# Patient Record
Sex: Female | Born: 1935 | Race: White | Hispanic: No | Marital: Single
Health system: Southern US, Community
[De-identification: ages and names within clinical notes are randomized; demographics above are authoritative.]

## PROBLEM LIST (undated history)

## (undated) DIAGNOSIS — I248 Other forms of acute ischemic heart disease: Secondary | ICD-10-CM

## (undated) DIAGNOSIS — Z87448 Personal history of other diseases of urinary system: Secondary | ICD-10-CM

## (undated) DIAGNOSIS — I5189 Other ill-defined heart diseases: Secondary | ICD-10-CM

## (undated) DIAGNOSIS — I4891 Unspecified atrial fibrillation: Secondary | ICD-10-CM

## (undated) HISTORY — DX: Unspecified atrial fibrillation: I48.91

## (undated) HISTORY — DX: Other ill-defined heart diseases: I51.89

## (undated) HISTORY — DX: Other forms of acute ischemic heart disease: I24.8

## (undated) HISTORY — DX: Personal history of other diseases of urinary system: Z87.448

---

## 2015-01-17 ENCOUNTER — Ambulatory Visit
Admit: 2015-01-17 | Disposition: A | Discharge status: Home / Self Care | Payer: Self-pay | Attending: Internal Medicine | Admitting: Internal Medicine

## 2015-01-31 ENCOUNTER — Inpatient Hospital Stay
Admit: 2015-01-31 | Disposition: A | Discharge status: Skilled Nursing Facility | Payer: Self-pay | Attending: Internal Medicine | Admitting: Internal Medicine

## 2015-01-31 LAB — URINALYSIS, COMPLETE
BACTERIA: NONE SEEN
BILIRUBIN, UR: NEGATIVE
GLUCOSE, UR: NEGATIVE mg/dL (ref 0–75)
Ketone: NEGATIVE
Leukocyte Esterase: NEGATIVE
Nitrite: NEGATIVE
PH: 5 (ref 4.5–8.0)
Protein: 100
SPECIFIC GRAVITY: 1.017 (ref 1.003–1.030)
SQUAMOUS EPITHELIAL: NONE SEEN

## 2015-01-31 LAB — CBC WITH DIFFERENTIAL/PLATELET
BASOS ABS: 0 10*3/uL (ref 0.0–0.1)
Basophil %: 0.4 %
EOS PCT: 0 %
Eosinophil #: 0 10*3/uL (ref 0.0–0.7)
HCT: 35 % (ref 35.0–47.0)
HGB: 11.4 g/dL — ABNORMAL LOW (ref 12.0–16.0)
LYMPHS ABS: 0.6 10*3/uL — AB (ref 1.0–3.6)
LYMPHS PCT: 7.5 %
MCH: 30.6 pg (ref 26.0–34.0)
MCHC: 32.7 g/dL (ref 32.0–36.0)
MCV: 94 fL (ref 80–100)
Monocyte #: 0.3 x10 3/mm (ref 0.2–0.9)
Monocyte %: 4.4 %
Neutrophil #: 7 10*3/uL — ABNORMAL HIGH (ref 1.4–6.5)
Neutrophil %: 87.7 %
Platelet: 131 10*3/uL — ABNORMAL LOW (ref 150–440)
RBC: 3.73 10*6/uL — ABNORMAL LOW (ref 3.80–5.20)
RDW: 13 % (ref 11.5–14.5)
WBC: 8 10*3/uL (ref 3.6–11.0)

## 2015-01-31 LAB — TROPONIN I
TROPONIN-I: 0.13 ng/mL — AB
Troponin-I: 0.11 ng/mL — ABNORMAL HIGH
Troponin-I: 0.13 ng/mL — ABNORMAL HIGH

## 2015-01-31 LAB — LACTIC ACID, PLASMA: Lactic Acid, Venous: 1.9 mmol/L

## 2015-01-31 LAB — PHOSPHORUS: Phosphorus: 3.6 mg/dL

## 2015-01-31 LAB — RAPID INFLUENZA A&B ANTIGENS (ARMC ONLY)

## 2015-01-31 LAB — COMPREHENSIVE METABOLIC PANEL
AST: 50 U/L — AB
Albumin: 3.5 g/dL
Alkaline Phosphatase: 69 U/L
Anion Gap: 10 (ref 7–16)
BILIRUBIN TOTAL: 0.6 mg/dL
BUN: 48 mg/dL — ABNORMAL HIGH
CALCIUM: 8.2 mg/dL — AB
Chloride: 104 mmol/L
Co2: 19 mmol/L — ABNORMAL LOW
Creatinine: 1.93 mg/dL — ABNORMAL HIGH
EGFR (African American): 25 — ABNORMAL LOW
EGFR (Non-African Amer.): 22 — ABNORMAL LOW
Glucose: 221 mg/dL — ABNORMAL HIGH
POTASSIUM: 5.1 mmol/L
SGPT (ALT): 30 U/L
Sodium: 133 mmol/L — ABNORMAL LOW
TOTAL PROTEIN: 6.7 g/dL

## 2015-01-31 LAB — PROTIME-INR
INR: 1.2
PROTHROMBIN TIME: 15.1 s — AB

## 2015-01-31 LAB — MAGNESIUM: Magnesium: 1.8 mg/dL

## 2015-02-01 DIAGNOSIS — I340 Nonrheumatic mitral (valve) insufficiency: Secondary | ICD-10-CM | POA: Diagnosis not present

## 2015-02-01 DIAGNOSIS — E860 Dehydration: Secondary | ICD-10-CM

## 2015-02-01 DIAGNOSIS — I4891 Unspecified atrial fibrillation: Secondary | ICD-10-CM | POA: Diagnosis not present

## 2015-02-01 DIAGNOSIS — R7989 Other specified abnormal findings of blood chemistry: Secondary | ICD-10-CM | POA: Diagnosis not present

## 2015-02-01 LAB — CBC WITH DIFFERENTIAL/PLATELET
Basophil #: 0 10*3/uL (ref 0.0–0.1)
Basophil %: 0.1 %
EOS PCT: 0 %
Eosinophil #: 0 10*3/uL (ref 0.0–0.7)
HCT: 32.2 % — AB (ref 35.0–47.0)
HGB: 10.4 g/dL — ABNORMAL LOW (ref 12.0–16.0)
Lymphocyte #: 0.3 10*3/uL — ABNORMAL LOW (ref 1.0–3.6)
Lymphocyte %: 1.2 %
MCH: 30.4 pg (ref 26.0–34.0)
MCHC: 32.5 g/dL (ref 32.0–36.0)
MCV: 94 fL (ref 80–100)
MONOS PCT: 2.5 %
Monocyte #: 0.6 x10 3/mm (ref 0.2–0.9)
NEUTROS ABS: 23.7 10*3/uL — AB (ref 1.4–6.5)
NEUTROS PCT: 96.2 %
Platelet: 120 10*3/uL — ABNORMAL LOW (ref 150–440)
RBC: 3.43 10*6/uL — ABNORMAL LOW (ref 3.80–5.20)
RDW: 13 % (ref 11.5–14.5)
WBC: 24.6 10*3/uL — AB (ref 3.6–11.0)

## 2015-02-01 LAB — BASIC METABOLIC PANEL
Anion Gap: 8 (ref 7–16)
BUN: 60 mg/dL — ABNORMAL HIGH
CALCIUM: 8.2 mg/dL — AB
CREATININE: 2.32 mg/dL — AB
Chloride: 107 mmol/L
Co2: 22 mmol/L
EGFR (African American): 20 — ABNORMAL LOW
GFR CALC NON AF AMER: 17 — AB
Glucose: 152 mg/dL — ABNORMAL HIGH
Potassium: 4.4 mmol/L
SODIUM: 137 mmol/L

## 2015-02-02 LAB — CBC WITH DIFFERENTIAL/PLATELET
BASOS PCT: 0.1 %
Basophil #: 0 10*3/uL (ref 0.0–0.1)
Eosinophil #: 0 10*3/uL (ref 0.0–0.7)
Eosinophil %: 0 %
HCT: 32.4 % — AB (ref 35.0–47.0)
HGB: 10.7 g/dL — AB (ref 12.0–16.0)
LYMPHS ABS: 0.2 10*3/uL — AB (ref 1.0–3.6)
LYMPHS PCT: 1 %
MCH: 30.9 pg (ref 26.0–34.0)
MCHC: 33.1 g/dL (ref 32.0–36.0)
MCV: 93 fL (ref 80–100)
Monocyte #: 0.4 x10 3/mm (ref 0.2–0.9)
Monocyte %: 2.2 %
Neutrophil #: 18 10*3/uL — ABNORMAL HIGH (ref 1.4–6.5)
Neutrophil %: 96.7 %
Platelet: 123 10*3/uL — ABNORMAL LOW (ref 150–440)
RBC: 3.47 10*6/uL — AB (ref 3.80–5.20)
RDW: 13.4 % (ref 11.5–14.5)
WBC: 18.7 10*3/uL — AB (ref 3.6–11.0)

## 2015-02-02 LAB — BASIC METABOLIC PANEL
Anion Gap: 16 (ref 7–16)
BUN: 72 mg/dL — AB
CHLORIDE: 100 mmol/L — AB
CREATININE: 2.82 mg/dL — AB
Calcium, Total: 7.9 mg/dL — ABNORMAL LOW
Co2: 20 mmol/L — ABNORMAL LOW
GFR CALC AF AMER: 16 — AB
GFR CALC NON AF AMER: 14 — AB
Glucose: 226 mg/dL — ABNORMAL HIGH
Potassium: 4 mmol/L
Sodium: 136 mmol/L

## 2015-02-03 DIAGNOSIS — R7989 Other specified abnormal findings of blood chemistry: Secondary | ICD-10-CM | POA: Diagnosis not present

## 2015-02-03 DIAGNOSIS — I4891 Unspecified atrial fibrillation: Secondary | ICD-10-CM | POA: Diagnosis not present

## 2015-02-03 LAB — CBC WITH DIFFERENTIAL/PLATELET
BASOS ABS: 0 10*3/uL (ref 0.0–0.1)
Basophil %: 0.1 %
Eosinophil #: 0 10*3/uL (ref 0.0–0.7)
Eosinophil %: 0 %
HCT: 31.7 % — AB (ref 35.0–47.0)
HGB: 10.2 g/dL — ABNORMAL LOW (ref 12.0–16.0)
LYMPHS PCT: 1.5 %
Lymphocyte #: 0.2 10*3/uL — ABNORMAL LOW (ref 1.0–3.6)
MCH: 29.7 pg (ref 26.0–34.0)
MCHC: 32.1 g/dL (ref 32.0–36.0)
MCV: 93 fL (ref 80–100)
MONOS PCT: 2.5 %
Monocyte #: 0.4 x10 3/mm (ref 0.2–0.9)
Neutrophil #: 14 10*3/uL — ABNORMAL HIGH (ref 1.4–6.5)
Neutrophil %: 95.9 %
PLATELETS: 131 10*3/uL — AB (ref 150–440)
RBC: 3.42 10*6/uL — AB (ref 3.80–5.20)
RDW: 13.2 % (ref 11.5–14.5)
WBC: 14.6 10*3/uL — AB (ref 3.6–11.0)

## 2015-02-03 LAB — BASIC METABOLIC PANEL
Anion Gap: 8 (ref 7–16)
BUN: 85 mg/dL — AB
Calcium, Total: 8.1 mg/dL — ABNORMAL LOW
Chloride: 104 mmol/L
Co2: 24 mmol/L
Creatinine: 3.12 mg/dL — ABNORMAL HIGH
EGFR (African American): 14 — ABNORMAL LOW
EGFR (Non-African Amer.): 12 — ABNORMAL LOW
Glucose: 194 mg/dL — ABNORMAL HIGH
Potassium: 4.1 mmol/L
SODIUM: 136 mmol/L

## 2015-02-03 LAB — VANCOMYCIN, TROUGH: VANCOMYCIN, TROUGH: 12 ug/mL

## 2015-02-04 LAB — BASIC METABOLIC PANEL
ANION GAP: 7 (ref 7–16)
BUN: 93 mg/dL — ABNORMAL HIGH
CO2: 25 mmol/L
CREATININE: 3.18 mg/dL — AB
Calcium, Total: 7.9 mg/dL — ABNORMAL LOW
Chloride: 102 mmol/L
EGFR (African American): 14 — ABNORMAL LOW
GFR CALC NON AF AMER: 12 — AB
GLUCOSE: 177 mg/dL — AB
POTASSIUM: 4.2 mmol/L
Sodium: 134 mmol/L — ABNORMAL LOW

## 2015-02-05 ENCOUNTER — Encounter: Payer: Self-pay | Admitting: Physician Assistant

## 2015-02-05 DIAGNOSIS — I4891 Unspecified atrial fibrillation: Secondary | ICD-10-CM | POA: Diagnosis not present

## 2015-02-05 LAB — BASIC METABOLIC PANEL
Anion Gap: 10 (ref 7–16)
BUN: 89 mg/dL — ABNORMAL HIGH
CALCIUM: 8.1 mg/dL — AB
CO2: 28 mmol/L
CREATININE: 2.75 mg/dL — AB
Chloride: 101 mmol/L
EGFR (African American): 16 — ABNORMAL LOW
EGFR (Non-African Amer.): 14 — ABNORMAL LOW
Glucose: 250 mg/dL — ABNORMAL HIGH
Potassium: 3.8 mmol/L
Sodium: 139 mmol/L

## 2015-02-05 LAB — CULTURE, BLOOD (SINGLE)

## 2015-02-06 DIAGNOSIS — I4891 Unspecified atrial fibrillation: Secondary | ICD-10-CM | POA: Diagnosis not present

## 2015-02-06 LAB — BASIC METABOLIC PANEL
Anion Gap: 10 (ref 7–16)
BUN: 85 mg/dL — ABNORMAL HIGH
CALCIUM: 8.4 mg/dL — AB
CO2: 33 mmol/L — AB
CREATININE: 2.31 mg/dL — AB
Chloride: 100 mmol/L — ABNORMAL LOW
EGFR (Non-African Amer.): 18 — ABNORMAL LOW
GFR CALC AF AMER: 20 — AB
Glucose: 112 mg/dL — ABNORMAL HIGH
POTASSIUM: 3.4 mmol/L — AB
SODIUM: 143 mmol/L

## 2015-02-06 LAB — CBC WITH DIFFERENTIAL/PLATELET
BASOS ABS: 0 10*3/uL (ref 0.0–0.1)
BASOS PCT: 0 %
EOS ABS: 0 10*3/uL (ref 0.0–0.7)
Eosinophil %: 0 %
HCT: 33.1 % — AB (ref 35.0–47.0)
HGB: 11 g/dL — AB (ref 12.0–16.0)
LYMPHS PCT: 1.3 %
Lymphocyte #: 0.1 10*3/uL — ABNORMAL LOW (ref 1.0–3.6)
MCH: 30.5 pg (ref 26.0–34.0)
MCHC: 33.2 g/dL (ref 32.0–36.0)
MCV: 92 fL (ref 80–100)
MONOS PCT: 3.2 %
Monocyte #: 0.3 x10 3/mm (ref 0.2–0.9)
NEUTROS PCT: 95.5 %
Neutrophil #: 9.8 10*3/uL — ABNORMAL HIGH (ref 1.4–6.5)
Platelet: 148 10*3/uL — ABNORMAL LOW (ref 150–440)
RBC: 3.61 10*6/uL — AB (ref 3.80–5.20)
RDW: 13 % (ref 11.5–14.5)
WBC: 10.2 10*3/uL (ref 3.6–11.0)

## 2015-02-06 LAB — TSH: Thyroid Stimulating Horm: 0.048 u[IU]/mL — ABNORMAL LOW

## 2015-02-06 LAB — MAGNESIUM: MAGNESIUM: 2.2 mg/dL

## 2015-02-06 LAB — T4, FREE: Free Thyroxine: 1.87 ng/dL — ABNORMAL HIGH

## 2015-02-07 DIAGNOSIS — I4891 Unspecified atrial fibrillation: Secondary | ICD-10-CM

## 2015-02-07 LAB — CBC WITH DIFFERENTIAL/PLATELET
BASOS ABS: 0 10*3/uL (ref 0.0–0.1)
Basophil %: 0.1 %
EOS ABS: 0 10*3/uL (ref 0.0–0.7)
Eosinophil %: 0 %
HCT: 35.4 % (ref 35.0–47.0)
HGB: 11.6 g/dL — ABNORMAL LOW (ref 12.0–16.0)
Lymphocyte #: 0.2 10*3/uL — ABNORMAL LOW (ref 1.0–3.6)
Lymphocyte %: 1.7 %
MCH: 30.1 pg (ref 26.0–34.0)
MCHC: 32.7 g/dL (ref 32.0–36.0)
MCV: 92 fL (ref 80–100)
Monocyte #: 0.4 x10 3/mm (ref 0.2–0.9)
Monocyte %: 3.4 %
NEUTROS ABS: 11.4 10*3/uL — AB (ref 1.4–6.5)
Neutrophil %: 94.8 %
Platelet: 151 10*3/uL (ref 150–440)
RBC: 3.84 10*6/uL (ref 3.80–5.20)
RDW: 12.6 % (ref 11.5–14.5)
WBC: 12 10*3/uL — ABNORMAL HIGH (ref 3.6–11.0)

## 2015-02-07 LAB — BASIC METABOLIC PANEL
ANION GAP: 11 (ref 7–16)
BUN: 86 mg/dL — ABNORMAL HIGH
Calcium, Total: 8.7 mg/dL — ABNORMAL LOW
Chloride: 98 mmol/L — ABNORMAL LOW
Co2: 36 mmol/L — ABNORMAL HIGH
Creatinine: 1.96 mg/dL — ABNORMAL HIGH
EGFR (African American): 25 — ABNORMAL LOW
EGFR (Non-African Amer.): 21 — ABNORMAL LOW
GLUCOSE: 171 mg/dL — AB
Potassium: 3.6 mmol/L
SODIUM: 145 mmol/L

## 2015-02-08 ENCOUNTER — Emergency Department
Admit: 2015-02-08 | Disposition: A | Discharge status: Home / Self Care | Payer: Self-pay | Admitting: Emergency Medicine

## 2015-02-08 ENCOUNTER — Encounter
Admit: 2015-02-08 | Disposition: A | Discharge status: Home / Self Care | Payer: Self-pay | Attending: Internal Medicine | Admitting: Internal Medicine

## 2015-02-08 LAB — URINALYSIS, COMPLETE
Bacteria: NONE SEEN
Bilirubin,UR: NEGATIVE
Glucose,UR: 50 mg/dL (ref 0–75)
Ketone: NEGATIVE
LEUKOCYTE ESTERASE: NEGATIVE
NITRITE: NEGATIVE
PH: 5 (ref 4.5–8.0)
Specific Gravity: 1.015 (ref 1.003–1.030)

## 2015-02-10 ENCOUNTER — Inpatient Hospital Stay
Admit: 2015-02-10 | Disposition: A | Discharge status: Hospice | Payer: Self-pay | Attending: Internal Medicine | Admitting: Internal Medicine

## 2015-02-10 LAB — CBC
HCT: 31.9 % — ABNORMAL LOW (ref 35.0–47.0)
HGB: 10.1 g/dL — AB (ref 12.0–16.0)
MCH: 29.7 pg (ref 26.0–34.0)
MCHC: 31.6 g/dL — AB (ref 32.0–36.0)
MCV: 94 fL (ref 80–100)
PLATELETS: 140 10*3/uL — AB (ref 150–440)
RBC: 3.39 10*6/uL — AB (ref 3.80–5.20)
RDW: 12.9 % (ref 11.5–14.5)
WBC: 17.2 10*3/uL — ABNORMAL HIGH (ref 3.6–11.0)

## 2015-02-10 LAB — URINALYSIS, COMPLETE
BILIRUBIN, UR: NEGATIVE
BLOOD: NEGATIVE
Glucose,UR: 50 mg/dL (ref 0–75)
Ketone: NEGATIVE
Leukocyte Esterase: NEGATIVE
Nitrite: NEGATIVE
Ph: 5 (ref 4.5–8.0)
Protein: 100
SPECIFIC GRAVITY: 1.015 (ref 1.003–1.030)

## 2015-02-10 LAB — COMPREHENSIVE METABOLIC PANEL
ALK PHOS: 63 U/L
AST: 48 U/L — AB
Albumin: 2.9 g/dL — ABNORMAL LOW
Anion Gap: 11 (ref 7–16)
BILIRUBIN TOTAL: 0.7 mg/dL
BUN: 97 mg/dL — AB
CO2: 41 mmol/L — AB
Calcium, Total: 8.6 mg/dL — ABNORMAL LOW
Chloride: 97 mmol/L — ABNORMAL LOW
Creatinine: 2.19 mg/dL — ABNORMAL HIGH
EGFR (African American): 22 — ABNORMAL LOW
EGFR (Non-African Amer.): 19 — ABNORMAL LOW
Glucose: 340 mg/dL — ABNORMAL HIGH
POTASSIUM: 4.3 mmol/L
SGPT (ALT): 108 U/L — ABNORMAL HIGH
Sodium: 149 mmol/L — ABNORMAL HIGH
TOTAL PROTEIN: 5.7 g/dL — AB

## 2015-02-10 LAB — TROPONIN I
TROPONIN-I: 0.11 ng/mL — AB
Troponin-I: 0.11 ng/mL — ABNORMAL HIGH

## 2015-02-10 LAB — URINE CULTURE

## 2015-02-10 LAB — HEMOGLOBIN: HGB: 10.4 g/dL — AB (ref 12.0–16.0)

## 2015-02-11 LAB — BASIC METABOLIC PANEL
Anion Gap: 8 (ref 7–16)
BUN: 112 mg/dL — ABNORMAL HIGH
CALCIUM: 8.7 mg/dL — AB
CHLORIDE: 104 mmol/L
CO2: 41 mmol/L — AB
Creatinine: 2.15 mg/dL — ABNORMAL HIGH
EGFR (Non-African Amer.): 19 — ABNORMAL LOW
GFR CALC AF AMER: 22 — AB
GLUCOSE: 104 mg/dL — AB
Potassium: 3.8 mmol/L
SODIUM: 153 mmol/L — AB

## 2015-02-11 LAB — CBC WITH DIFFERENTIAL/PLATELET
BASOS PCT: 0.2 %
Basophil #: 0 10*3/uL (ref 0.0–0.1)
Eosinophil #: 0.1 10*3/uL (ref 0.0–0.7)
Eosinophil %: 0.7 %
HCT: 31.6 % — ABNORMAL LOW (ref 35.0–47.0)
HGB: 10.2 g/dL — AB (ref 12.0–16.0)
LYMPHS ABS: 0.7 10*3/uL — AB (ref 1.0–3.6)
LYMPHS PCT: 3.9 %
MCH: 30 pg (ref 26.0–34.0)
MCHC: 32.4 g/dL (ref 32.0–36.0)
MCV: 93 fL (ref 80–100)
Monocyte #: 0.7 x10 3/mm (ref 0.2–0.9)
Monocyte %: 4 %
Neutrophil #: 15.8 10*3/uL — ABNORMAL HIGH (ref 1.4–6.5)
Neutrophil %: 91.2 %
PLATELETS: 116 10*3/uL — AB (ref 150–440)
RBC: 3.4 10*6/uL — ABNORMAL LOW (ref 3.80–5.20)
RDW: 12.8 % (ref 11.5–14.5)
WBC: 17.3 10*3/uL — ABNORMAL HIGH (ref 3.6–11.0)

## 2015-02-12 LAB — CBC WITH DIFFERENTIAL/PLATELET
Basophil #: 0.1 x10 3/mm 3 (ref 0.0–0.1)
Basophil %: 0.6 %
Eosinophil #: 0.1 x10 3/mm 3 (ref 0.0–0.7)
Eosinophil %: 0.7 %
HCT: 28.8 % — ABNORMAL LOW (ref 35.0–47.0)
HGB: 9.3 g/dL — ABNORMAL LOW (ref 12.0–16.0)
Lymphocyte %: 4.9 %
Lymphs Abs: 0.6 x10 3/mm 3 — ABNORMAL LOW (ref 1.0–3.6)
MCH: 29.6 pg (ref 26.0–34.0)
MCHC: 32.2 g/dL (ref 32.0–36.0)
MCV: 92 fL (ref 80–100)
Monocyte #: 0.5 x10 3/mm (ref 0.2–0.9)
Monocyte %: 3.7 %
Neutrophil #: 11.8 x10 3/mm 3 — ABNORMAL HIGH (ref 1.4–6.5)
Neutrophil %: 90.1 %
Platelet: 97 x10 3/mm 3 — ABNORMAL LOW (ref 150–440)
RBC: 3.13 X10 6/mm 3 — ABNORMAL LOW (ref 3.80–5.20)
RDW: 12.4 % (ref 11.5–14.5)
WBC: 13.1 x10 3/mm 3 — ABNORMAL HIGH (ref 3.6–11.0)

## 2015-02-12 LAB — BASIC METABOLIC PANEL WITH GFR
Anion Gap: 13 (ref 7–16)
BUN: 100 mg/dL — ABNORMAL HIGH
Calcium, Total: 8.2 mg/dL — ABNORMAL LOW
Chloride: 110 mmol/L
Co2: 32 mmol/L
Creatinine: 2.45 mg/dL — ABNORMAL HIGH
EGFR (African American): 19 — ABNORMAL LOW
EGFR (Non-African Amer.): 16 — ABNORMAL LOW
Glucose: 150 mg/dL — ABNORMAL HIGH
Potassium: 4.7 mmol/L
Sodium: 155 mmol/L — ABNORMAL HIGH

## 2015-02-13 LAB — BASIC METABOLIC PANEL
Anion Gap: 7 (ref 7–16)
BUN: 100 mg/dL — ABNORMAL HIGH
CALCIUM: 8.1 mg/dL — AB
Chloride: 103 mmol/L
Co2: 41 mmol/L
Creatinine: 2.41 mg/dL — ABNORMAL HIGH
GFR CALC AF AMER: 19 — AB
GFR CALC NON AF AMER: 17 — AB
GLUCOSE: 235 mg/dL — AB
Potassium: 3.5 mmol/L
SODIUM: 151 mmol/L — AB

## 2015-02-13 LAB — CBC WITH DIFFERENTIAL/PLATELET
Basophil #: 0 10*3/uL (ref 0.0–0.1)
Basophil %: 0.1 %
EOS PCT: 2.1 %
Eosinophil #: 0.2 10*3/uL (ref 0.0–0.7)
HCT: 27.6 % — ABNORMAL LOW (ref 35.0–47.0)
HGB: 9.1 g/dL — ABNORMAL LOW (ref 12.0–16.0)
Lymphocyte #: 0.4 10*3/uL — ABNORMAL LOW (ref 1.0–3.6)
Lymphocyte %: 3.6 %
MCH: 30.8 pg (ref 26.0–34.0)
MCHC: 32.9 g/dL (ref 32.0–36.0)
MCV: 94 fL (ref 80–100)
MONOS PCT: 4.1 %
Monocyte #: 0.4 x10 3/mm (ref 0.2–0.9)
Neutrophil #: 9.7 10*3/uL — ABNORMAL HIGH (ref 1.4–6.5)
Neutrophil %: 90.1 %
Platelet: 94 10*3/uL — ABNORMAL LOW (ref 150–440)
RBC: 2.95 10*6/uL — ABNORMAL LOW (ref 3.80–5.20)
RDW: 12.9 % (ref 11.5–14.5)
WBC: 10.7 10*3/uL (ref 3.6–11.0)

## 2015-02-16 NOTE — H&P (Signed)
PATIENT NAME:  Caitlin Crawford, STUCKEY MR#:  [id number] DATE OF BIRTH:  05/30/1918  DATE OF ADMISSION:  01/31/2015   CHIEF COMPLAINT: Shortness of breath, confusion.   HISTORY OF PRESENT ILLNESS: A 81 year old female patient with history of hypertension, diabetes, and dementia who presents to the Emergency Room from assisted living facility with shortness of breath, fever, and confusion. The patient was found to have saturations of 80% on room air, placed on a nonrebreather. Chest x-ray shows bilateral pneumonia and hospitalist team has been consulted for further admission and management.   Presently, the patient is on a nonrebreather, 100%. Seems critically ill. Says she has shortness of breath, has been coughing, but has been dry. No chest pain. No edema. Daughter gives most of the history at bedside. Symptoms started slowly about 2 days prior. No recent antibiotic use. Does not use any home oxygen.   PAST MEDICAL HISTORY:  1.  Diabetes mellitus.  2.  Hypertension.   PAST SURGICAL HISTORY:  1.  Right knee surgery.  2.  Appendectomy.  3.  Cholecystectomy.   ALLERGIES: MORPHINE, WHICH CAUSES HALLUCINATIONS.   SOCIAL HISTORY: The patient smoked in the remote past when she was young. No alcohol. No illicit drugs. Ambulates with a walker. Lives at assisted living.   CODE STATUS: Full code.   FAMILY HISTORY: Reviewed, but unknown to patient.   HOME MEDICATIONS:  1.  Robitussin 5 mL 3 times a day as needed.  2.  Multivitamin 1 tablet daily.  3.  Docusate sodium 100 mg 2 times a day as needed.  4.  Doxazosin 2 mg oral daily.  5.  Ferrous sulfate 325 mg oral 3 times daily.  6.  Fish oil 1000 mg daily.  6.  Lasix 20 mg daily.  7.  Glimepiride 1 mg half tablet daily.  8.  Potassium chloride 10 mEq daily.  9.  Lisinopril 40 mg daily.  10.  Tylenol 325 mg every 6 hours as needed for pain.  11.  Amlodipine 5 mg daily.   REVIEW OF SYSTEMS: Please see history of presenting illness, rest of the systems  reviewed and negative.   PHYSICAL EXAMINATION:  VITAL SIGNS: Temperature 97.7, pulse of 95, respirations 18, blood pressure 148/73, saturating 93% on nonrebreather.  GENERAL: Elderly Caucasian female patient lying in bed, seems critically ill, in conversational dyspnea.  PSYCHIATRIC: Alert, oriented to person but not to place and time.  HEENT: Atraumatic, normocephalic. Oral mucosa dry and pink. External ears are normal.  NECK: Supple. No thyromegaly. No palpable lymph nodes.  CARDIOVASCULAR: S1, S2, tachycardic. Systolic murmur. No edema.  RESPIRATORY: Bilateral wheezing, coarse breath sounds, increased work of breathing.  GASTROINTESTINAL: Soft abdomen, nontender. Bowel sounds present.  GENITOURINARY: No CVA tenderness or bladder distention.  SKIN: Warm and dry. No petechiae, rash.  NEUROLOGICAL: Motor strength 5/5 in upper and lower extremities.   LABORATORY STUDIES: Show glucose of 221, BUN 48, creatinine 1.93, sodium 133, potassium 5.1, chloride 104, bicarbonate 19. AST, ALT, alkaline phosphatase, bilirubin normal. Lactic acid 1.9. Troponin 0.11. WBC is 8, hemoglobin 11.4, platelets 131,000 with INR of 1.2 with influenza A and B negative. Urinalysis shows no bacteria.   PH of 7.44 with pCO2 of 26.   EKG shows atrial fibrillation.   Chest x-ray shows bilateral infiltrates. No edema.   ASSESSMENT AND PLAN:  1.  Bilateral pneumonia with acute respiratory failure and severe sepsis: The patient's influenza is negative. We will put her on broad-spectrum antibiotics for healthcare-acquired pneumonia; put her on  IV fluids, steroids; nebulizers scheduled along with oxygen support to keep her saturations over 92%. The patient is critically ill, may need intubation if she gets worse. The patient is a full code per conversation with her healthcare power of attorney, daughter at bedside. Also send for blood and sputum cultures.  2.  New onset atrial fibrillation and elevated troponin, likely from  her sepsis: Needs to be monitored. Check 2 more sets of cardiac enzymes. No need for rate control medications at this point.  3.  Acute encephalopathy over dementia due to her sepsis.  4.  Acute renal failure due to sepsis: Start on IV fluids.  5.  Hypertension: Hold medications.  6.  Diabetes mellitus: Continue her glimepiride from home, put her on sliding scale insulin, and monitor sugars before meals and at bedtime.  7.  Deep vein thrombosis prophylaxis with Lovenox.   CODE STATUS: Full code.   Critical care time spent on this patient was 45 minutes.    ____________________________ Molinda BailiffSrikar R. Safiyya Stokes, MD [id number] D: 01/31/2015 14:39:57 ET T: 01/31/2015 15:18:47 ET JOB#: [id number]  cc: Wardell HeathSrikar R. Tab Rylee, MD, <Dictator> Orie FishermanSRIKAR R Lyzbeth Genrich MD ELECTRONICALLY SIGNED 02/10/2015 11:21

## 2015-02-16 NOTE — Consult Note (Addendum)
PATIENT NAME:  Caitlin Crawford, Caitlin Crawford MR#:  [id number] DATE OF BIRTH:  12/07/1917  DATE OF CONSULTATION:  02/11/2015.  CONSULTING PHYSICIAN:  Scot Junobert T. Elliott, MD  REASON FOR CONSULTATION:   The patient is a 81 year old white female who was admitted to the hospital for GI bleed with melena.  I was asked to see her in consultation.   HISTORY OF PRESENT ILLNESS:  The patient was recently diagnosed with atrial fibrillation and was started on Eliquis.  She was discharged from the hospital on 02/07/2015 after being treated for a serious pneumonia.  The patient was found to have melena and her hemoglobin had dropped from near 12 to 10 and her BUN/creatinine ratio had gone up significantly.   The patient had pneumonia recently in the hospital and continues to be prednisone and azithromycin for this.   PAST MEDICAL HISTORY:  1. Diabetes mellitus.  2. Hypertension.  3. Dementia.  4. Right-sided facial palsy.  5. Atrial fibrillation, on Eliquis.  6. Chronic kidney disease, stage III.  7. Anemia of chronic disease.   PAST SURGICAL HISTORY:  Right knee surgery, appendectomy, and cholecystectomy.   ALLERGIES:  MORPHINE CAUSES HALLUCINATIONS.   SOCIAL HISTORY:  Lives in a nursing home.  Has been extremely weak.   CODE STATUS:  Full code.  That may be changed by the family.    HOME MEDICATIONS: "Put in a list of her home medications if you can do that from the admission H and P."   PHYSICAL EXAMINATION:  GENERAL:  Very elderly white female with evidence of previous surgery on the right side of the face.  CHEST:  Bilateral coarse breath sounds.  HEART:  No murmurs that I can hear.  NECK:  Midline trachea.  ABDOMEN:  Soft.  No hepatosplenomegaly, no masses, and no bruits.  SKIN:  Warm and dry.   LABORATORY DATA:  Glucose 340, BUN 97, creatinine 2.19, sodium 149, potassium 4.3, chloride 97, bicarbonate 41, AST 108.  ALT is 48.   Troponin 0.11.  WBC is 17.2, hemoglobin 10.1, platelet count 140,000.   CT scan  of the head shows right lateral cerebellar encephalomalacia.  CT of the abdomen and pelvis shows extensive colonic diverticulosis and resolving pneumonia and the lower lobe opacities.   ASSESSMENT:  The patient presented with melena and has a BUN that has risen significantly since recent hospitalization indicative of digestion of blood in the gastrointestinal tract as well as some degree of volume depletion.  I explained to the family that most of the time gastrointestinal bleeding that occurs with use of medication for atrial fibrillation often will stop on its own when the medicine is discontinued and the patient treated with a proton pump inhibitor medication and can avoid the need for an upper endoscopy, and I recommend that we pursue this approach for now.  They were in agreement.  I will follow with you and recommend daily hemoglobins and continue IV proton pump inhibitor until she can take oral.     ____________________________ Scot Junobert T. Elliott, MD rte:kc D: 02/11/2015 17:12:22 ET T: 02/11/2015 17:42:39 ET JOB#: [id number]  cc: Scot Junobert T. Elliott, MD, <Dictator> Marya AmslerMarshall W. Dareen PianoAnderson, MD  Scot JunOBERT T ELLIOTT MD ELECTRONICALLY SIGNED 02/23/2015 16:24

## 2015-02-16 NOTE — Discharge Summary (Addendum)
PATIENT NAME:  Caitlin Crawford, Caitlin Crawford MR#:  [id number] DATE OF BIRTH:  [birth year]  DATE OF ADMISSION:  01/31/2015 DATE OF DISCHARGE:  02/07/2015  DISPOSITION: To skilled nursing. The place is yet to be decided.  CONSULTATIONS: Cardiology with Dr. Dossie Arbour and physical therapy.  DISCHARGE DIAGNOSES: 1.  Acute hypoxic respiratory failure secondary to bilateral pneumonia.  2.  Atrial fibrillation with rapid ventricular response.  3.  Hypertension.  4.  Type 2 diabetes mellitus. 5.  Acute on chronic renal failure.  6.  Oral thrush.  DISCHARGE MEDICATIONS: 1.  Amlodipine 5 mg p.o. daily. 2.  Aspirin 81 mg p.o. daily. 3.  Fish oil 1 gram p.o. daily. 4.  Furosemide 20 mg p.o. daily. 5.  KCl 10 mEq p.o. daily. 6.  Lisinopril 40 mg daily. 7.  Ferrous sulfate 325 mg p.o. t.i.d. 8.  Doxazosin 2 mg p.o. daily. 9.  Robitussin 10 mL t.i.d. as needed for cough. 10.  Amaryl 1 mg 1/2 tablet daily.  11.  Colace 100 mg p.o. b.i.d. p.r.n. constipation. 12.  MAPAP (Tylenol) 325 mg q. 6 hours p.r.n. for pain. 13.  Cardizem CD 240 mg p.o. daily. 14.  Ipratropium 1 puff every 6 hours.  15.  Levalbuterol (Xopenex) nebulizer every 6 hours.  16.  Ensure Enlive 1 can p.o. t.i.d.  17.  Azithromycin 500 mg every 24 hours for 6 days  18.  Prednisone dose tablet 2 tablets daily for two days, 1 tablet daily for two days and then stop. 19.  Nystatin swish and spit for oral thrush 5 mL every 6 hours until thrush gets better.  HOSPITAL COURSE:  74.  A 81 year old female patient admitted on April 15th for shortness of breath and confusion. The patient's O2 sats were 80% on room air. Chest x-ray showed bilateral pneumonia so we admitted her for acute hypoxic respiratory failure secondary to pneumonia. The patient initially was on 100% nonrebreather and needed over 6 liters of oxygen for about 6 days and right now her pneumonia has been getting better. Initially we started her on vancomycin, azithromycin and Zosyn. Vancomycin was  discontinued because of renal failure, but we continued Zosyn and Zithromax. The patient right now feels better, less wheezing and O2 sats are better. She is on 2 liters of oxygen, saturating 94% and she is afebrile. The plan is to continue her on Zithromax for another 6 days. The patient received Zosyn for 8 days. We are covering with azithromycin alone.  2.  Wheezing secondary to reactive airway disease from pneumonia. She received IV Solu-Medrol and Xopenex nebulizer and she has no wheezing now and the plan is to continue tapering dose of prednisone and also nebulizers.  3.  Atrial fibrillation with RVR. Heart rate has been around 110. She has no history of A-fib. She had A-fib with RVR on admission. Consultation with cardiology from [facility], seen by Dr. Kirke Corin and the patient started on Cardizem 30 mg q. 6 hours and then changed to Cardizem CD at 240 mg daily. The patient is not on full dose anticoagulation because of advanced age and her risk of fall. She is on aspirin alone. The patient is to follow up with them as needed. The patient's echocardiogram showed EF more than 55% with normal LV function. 4.  Renal failure. The patient had creatinine of 1.93 on admission and BUN 48, but her renal function got worse and creatinine gradually rose up to as high as 3.18. Concerning this we asked for a  nephrology consult and we stopped her vancomycin and lisinopril. The patient seen by Dr. Thedore MinsSingh and he recommended she may be having acute worsening of her chronic renal failure with her baseline creatinine 1.9. He recommended monitoring closely and the patient was given Lasix to help her with her shortness of breath and rales on clinical exam. The patient improved with Lasix and the plan is to continue home dose Lasix at home. The patient's creatinine today is 1.9. She is thought to have acute on chronic renal failure with ATN due to sepsis. Now her function is recovering. She has azotemia secondary to  steroids.  5.  Dementia and history of tic [place] operation with facial palsy related to surgery. She is seen by palliative care, by Dr. Harvie JuniorPhifer. The family wanted her to go home. The patient was FULL code. Family [facility] and they were hopeful so she is a FULL code. 6.  The patient has some difficulty swallowing on occasion. We requested speech therapy evaluation. Seen by Natalia LeatherwoodKatherine from speech therapy. She is right now on mechanical soft diet with Ensure. No straws with liquids. The family is aware about the dietary instructions. Right now she is stable. She has oral thrush. We started her on nystatin.   PHYSICAL EXAMINATION: VITAL SIGNS: Today temperature is 99.7, heart rate 106, blood pressure 170/75 and sats 98% on room air.  CARDIOVASCULAR: S1 and S2, irregular.  LUNGS: Clear to auscultation. No wheeze. No rales. ABDOMEN: Soft, nontender, nondistended. Bowel sounds present. EXTREMITIES: No edema. No cyanosis. No clubbing.   TIME SPENT ON DISCHARGE PREPARATION: More than 30 minutes.   The plan is to discharge her to SNF and from there [facility] she gets physical therapy.   ____________________________ Caitlin HammingSnehalatha Saivon Prowse, MD sk:sb D: 02/07/2015 08:38:37 ET T: 02/07/2015 09:38:02 ET JOB#: [id number]  cc: Caitlin HammingSnehalatha Genni Buske, MD, <Dictator> Caitlin HammingSNEHALATHA Elaine Roanhorse MD ELECTRONICALLY SIGNED 02/17/2015 22:13

## 2015-02-16 NOTE — Consult Note (Signed)
General Aspect A 81 year old female patient with history of hypertension, diabetes, and dementia who presents with pneumonia and new onset afib.  She has chronic difficulty with confusion and falls.  She now lives in a nursing home.  She presents with progressive SOB and confusion.  She has been diagnosed with pneumonia.  She has also been found to have af with RVR.  She is unaware. She denies CP, dizziness, or other CV symptoms.   Present Illness PAST MEDICAL HISTORY:  1.  Diabetes mellitus.  2.  Hypertension.   PAST SURGICAL HISTORY:  1.  Right knee surgery.  2.  Appendectomy.  3.  Cholecystectomy.   ALLERGIES: MORPHINE, WHICH CAUSES HALLUCINATIONS.   SOCIAL HISTORY: The patient smoked in the remote past when she was young. No alcohol. No illicit drugs. Ambulates with a walker. Lives at [facility] due to prior falls   CODE STATUS: Full code.   FAMILY HISTORY: Reviewed, but unknown to patient.   HOME MEDICATIONS:  1.  Robitussin 5 mL 3 times a day as needed.  2.  Multivitamin 1 tablet daily.  3.  Docusate sodium 100 mg 2 times a day as needed.  4.  Doxazosin 2 mg oral daily.  5.  Ferrous sulfate 325 mg oral 3 times daily.  6.  Fish oil 1000 mg daily.  6.  Lasix 20 mg daily.  7.  Glimepiride 1 mg half tablet daily.  8.  Potassium chloride 10 mEq daily.  9.  Lisinopril 40 mg daily.  10.  Tylenol 325 mg every 6 hours as needed for pain.  11.  Amlodipine 5 mg daily.  ROS- all systems are reviewed and negative except as per HPI above   Physical Exam:  GEN elderly, chronically ill   HEENT R facial droop, ptosis and blindness   NECK supple   RESP coarse BS, wheezing, tachypneic   CARD Irregular rate and rhythm  Tachycardic   ABD denies tenderness   EXTR + chronic venous stasis changes with support hose in place   SKIN normal to palpation   NEURO R facial droop   PSYCH alert, pleasantly confused   Lab Results: Routine Chem:  16-Apr-16 04:26   Glucose, Serum  152  (65-99 NOTE: New Reference Range  12/24/14)  BUN  60 (6-20 NOTE: New Reference Range  12/24/14)  Creatinine (comp)  2.32 (0.44-1.00 NOTE: New Reference Range  12/24/14)  Sodium, Serum 137 (135-145 NOTE: New Reference Range  12/24/14)  Potassium, Serum 4.4 (3.5-5.1 NOTE: New Reference Range  12/24/14)  Chloride, Serum 107 (101-111 NOTE: New Reference Range  12/24/14)  CO2, Serum 22 (22-32 NOTE: New Reference Range  12/24/14)  Calcium (Total), Serum  8.2 (8.9-10.3 NOTE: New Reference Range  12/24/14)  Anion Gap 8  eGFR (African American)  20  eGFR (Non-African American)  17 (eGFR values <73m/min/1.73 m2 may be an indication of chronic kidney disease (CKD). Calculated eGFR is useful in patients with stable renal function. The eGFR calculation will not be reliable in acutely ill patients when serum creatinine is changing rapidly. It is not useful in patients on dialysis. The eGFR calculation may not be applicable to patients at the low and high extremes of body sizes, pregnant women, and vegetarians.)  Routine Hem:  16-Apr-16 04:26   WBC (CBC)  24.6  RBC (CBC)  3.43  Hemoglobin (CBC)  10.4  Hematocrit (CBC)  32.2  Platelet Count (CBC)  120  MCV 94  MCH 30.4  MCHC 32.5  RDW 13.0  Neutrophil % 96.2  Lymphocyte % 1.2  Monocyte % 2.5  Eosinophil % 0.0  Basophil % 0.1  Neutrophil #  23.7  Lymphocyte #  0.3  Monocyte # 0.6  Eosinophil # 0.0  Basophil # 0.0 (Result(s) reported on 01 Feb 2015 at 05:18AM.)    Morphine: Unknown  Vital Signs/Nurse's Notes: **Vital Signs.:   16-Apr-16 04:43  Vital Signs Type Routine  Temperature Temperature (F) 98.6  Celsius 37  Temperature Source oral  Pulse Pulse 111  Respirations Respirations 22  Systolic BP Systolic BP 101  Diastolic BP (mmHg) Diastolic BP (mmHg) 66  Mean BP 86  Pulse Ox % Pulse Ox % 97  Pulse Ox Activity Level  At rest  Oxygen Delivery Non Rebreather Mask    Impression 81 yo female admitted with  respiratory failure secondary to pneumonia.  Found to have afib with RVR and dehydration. EKG reveals no ischemic changes.  1. Afib chads2vasc score is at least 5 ideally would recommend long term anticoagulation. given prior falls, she may not be a candidate for anticoagulation echo pending would consider gentle rate control with low dose beta blocker as respiratory failure/ wheezing improves  2. mildly elevated troponin demand event due to pneumonia/ hypoxia, and afib  no CV workup planned add low dose beta blocker as above when able  3. dehydration/ ARF gentle hydration   Plan given advanced age, a conservative approach is advised cardiology to see as needed over the weekend   Electronic Signatures: Coralyn Mark (MD)  (Signed 16-Apr-16 11:19)  Authored: General Aspect/Present Illness, History and Physical Exam, Labs, Allergies, Vital Signs/Nurse's Notes, Impression/Plan   Last Updated: 16-Apr-16 11:19 by Coralyn Mark (MD)

## 2015-02-16 NOTE — Discharge Summary (Addendum)
PATIENT NAME:  Caitlin Crawford, Blakeleigh MR#:  [id number] DATE OF BIRTH:  [birth year]  DATE OF ADMISSION:  01/31/2015 DATE OF DISCHARGE:  02/07/2015  ADDENDUM  The patient's family is given the option to see if they want her on Eliquis for anticoagulation for atrial fibrillation and Dr. Kirke CorinArida discussed with the patient's daughter and she is agreeable to start the Eliquis 2.5 mg b.i.d. and also we are giving metoprolol. We increased the metoprolol to 25 mg b.i.d. because of the persistent elevation of heart rate more than 100.    Addendum too less than 30 minutes of time.  ____________________________ Katha HammingSnehalatha Kathey Simer, MD sk:AT D: 02/07/2015 10:24:00 ET T: 02/07/2015 11:11:17 ET JOB#: [id number]  cc: Katha HammingSnehalatha Brenyn Petrey, MD, <Dictator> Katha HammingSNEHALATHA Khrystina Bonnes MD ELECTRONICALLY SIGNED 03/13/2015 22:14

## 2015-02-16 NOTE — Consult Note (Signed)
No active bleeding, no fall in hgb.  I will sign off reconsult if needed.  Electronic Signatures: Scot JunElliott, Rayma Hegg T (MD)  (Signed on 27-Apr-16 18:10)  Authored  Last Updated: 27-Apr-16 18:10 by Scot JunElliott, July Nickson T (MD)

## 2015-02-16 NOTE — Discharge Summary (Signed)
Dates of Admission and Diagnosis:  Date of Admission 10-Feb-2015   Date of Discharge 13-Feb-2015   Admitting Diagnosis Hypernatremia   Final Diagnosis 1. Acute encephalopathy 2. Dehydration 3. Hypernatremia 4. acute renal failure over Chronic kdney disease 3 5. Recent pneumonai 6. pAfib on Eliquis 7. GI bleed 8. Acute blood loss anemia    Chief Complaint/History of Present Illness PRIMARY CARE PHYSICIAN:  Dr. Einar Crow with [facility].   CHIEF COMPLAINT: Syncope, confusion, and weakness.   HISTORY OF PRESENTING ILLNESS: A 81 year old Caucasian female patient with history of CKD stage III, hypertension, diabetes, dementia, and recently diagnosed atrial fibrillation and started on Eliquis, presents to the Emergency Room from a nursing home after she was noticed to have an episode of syncope.   The patient is unable to contribute to history at this point, but does follow commands. History has been obtained from old records, ER staff, and daughter at bedside.  Also her records from the nursing home have been reviewed.   The patient was recently discharged from the hospital on 02/07/2015 after being treated for pneumonia. According to the daughter she has had periods of confusion at the nursing home. She does talk at times, but most of the time she chooses not to speak. She seemed to be dizzy, unable to stand up at times, very weak to work with PT and OT. Today the patient was on a bedside commode and was being lifted up by PT when she was not responsive and had an episode of syncope for a few seconds after having a bowel movement, and then she woke up. Here in the Emergency Room the patient has been found to have melena, hemoglobin slowly trending down to 10 from high 11s.  Orthostatic vitals have not been done. She is in atrial fibrillation with hypernatremia, dehydration, elevated troponin, GI bleed, and is being admitted to the hospitalist service.   She continues to be on  prednisone and azithromycin at the nursing home for her pneumonia and COPD exacerbation.   Allergies:  Morphine: Unknown  Pertinent Past History:  Pertinent Past History Afib hypertension coronary artery disease Right facial palsy Chronic kdney disease 3 DIABETES MELLITUS 2   Hospital Course:  Hospital Course 82 yo female w/ hx of chronic a. fib, HTN, Dementia, hx of CHF, CKD stage III, hx of right sided facial paralysis came into hospital due to melanotic stools and weakness.   1. GI bleed - likely cause of pt's melanotic stools.  - Hg. stable.  off eliquis now.  - appreciate GI input and no plans on any endoscopic evaluation give her advanced age.   - cont. Protonix.  Not taking much PO.    2. Acute on chronic renal failure - likely due to volume loss from GI bleed/dehydration and poor PO intake. .  - Cr. a bit worse today. Will start on D5W given hypernatremia and will monitor.  - hold Lasix, ACE for now.   3. Hypernatremia - likely due to dehyration and poor PO intake.  - will place on D5W and follow sodium.   4. Chronic a. fib - rate controlled.  - cont. Cardizem, Metoprolol.  - cont. to hold Eliquis given GI bleed.   5. DM - cont. SSI  6. hx of CHF - clinically not in CHF - lasix, ACE on hold due to renal failure.  - cont. B-blocker.  No significant improvement inspite of aggressive care. Palliative care has discussed with [facility] and family chose to send  patient to hospice home for end of like care.  Time spent on day of discharge 45 minutes   Condition on Discharge Poor   Code Status:  Code Status No Code/Do Not Resuscitate   PHYSICAL EXAM ON DISCHARGE:  Physical Exam:  GEN no acute distress, critically ill appearing   HEENT pale conjunctivae   NECK supple   RESP clear BS   CARD irregular rate   DISCHARGE INSTRUCTIONS HOME MEDS:  Medication Reconciliation: Patient's Home Medications at Discharge:     Medication Instructions  lorazepam 0.5 mg  oral tablet  1-2 tab(s) orally/sublingual every 2 to 4 hours as needed for agitation/anxiety   dilaudid 2 mg oral tablet  1 tab(s) orally    zofran odt 4 mg oral tablet, disintegrating  1 tab(s) orally every 6 hours, As Needed- for Nausea, Vomiting    quetiapine 25 mg oral tablet  1 tab(s) orally once a day (at bedtime)    STOP TAKING THE FOLLOWING MEDICATION(S):    amlodipine 5 mg oral tablet: 1 tab(s) orally once a day fish oil 1000 mg oral capsule: 1 cap(s) orally once a day furosemide 20 mg oral tablet: 1 tab(s) orally once a day ocusoft - topical soap: Apply topically to right eyelid once a day (in the morning) tab-a-vite multiple vitamins oral tablet: 1 tab(s) orally once a day ferrous sulfate 325 mg oral tablet: 1 tab(s) orally 3 times a day glimepiride 1 mg oral tablet: 0.5 tab(s) orally once a day (in the morning) (after breakfast) docusate sodium 100 mg oral capsule: 1 cap(s) orally 2 times a day, As Needed - for Constipation diltiazem 240 mg/24 hours oral capsule, extended release: 1 cap(s) orally once a day nystatin 100,000 units/ml oral suspension: 5 milliliter(s) orally every 6 hours metoprolol tartrate 25 mg oral tablet: 1 tab(s) orally 2 times a day doxazosin 1 mg oral tablet: 2 tab(s) orally once a day (at bedtime) eliquis 2.5 mg oral tablet: 1 tab(s) orally 2 times a day ipratropium 500 mcg/2.5 ml inhalation solution: 2.5 milliliter(s) inhaled every 6 hours lisinopril 10 mg oral tablet: 1 tab(s) orally once a day potassium chloride 10 meq oral capsule, extended release: 1 cap(s) orally once a day guaifenesin 100 mg/5 ml oral liquid: 10 milliliter(s) orally 3 times a day for 10 days refresh preserved ophthalmic solution: 2 drop(s) to each eye 3 times a day azithromycin 500 mg oral tablet: 1 tab(s) orally once a day for 6 days mag-al plus xs 400 mg-400 mg-40 mg/5 ml oral suspension: 30 milliliter(s) orally every 4 hours, As Needed - for Indigestion, Heartburn novolog 100  units/ml subcutaneous solution:  subcutaneous 4 times a day (before meals and at bedtime), As Needed per sliding scale prednisone 10 mg oral tablet: 1 tab(s) orally once a day for 2 days acetaminophen 325 mg oral tablet: 1 tab(s) orally every 6 hours, As Needed - for Pain levalbuterol 0.63 mg/3 ml inhalation solution: 3 milliliter(s) inhaled every 6 hours, As Needed - for Wheezing promethazine 25 mg oral tablet: 1 tab(s) orally every 4 hours, As Needed - for Nausea, Vomiting promethazine 25 mg rectal suppository: 1 suppository(ies) rectal every 4 hours, As Needed - for Nausea, Vomiting  Physician's Instructions:  Diet Regular   Diet Consistency Regular Consistency   Activity Limitations As tolerated   Return to Work Not Applicable   Time frame for Follow Up Appointment 1-2 days  Hospice   Electronic Signatures: Oree Mirelez, Andreas BlowerSrikar Reddy (MD)  (Signed 29-Apr-16 16:22)  Authored: ADMISSION DATE  AND DIAGNOSIS, CHIEF COMPLAINT/HPI, Allergies, PERTINENT PAST HISTORY, HOSPITAL COURSE, PHYSICAL EXAM ON DISCHARGE, DISCHARGE INSTRUCTIONS HOME MEDS, PATIENT INSTRUCTIONS   Last Updated: 29-Apr-16 16:22 by Minette Headland (MD)

## 2015-02-16 NOTE — H&P (Signed)
PATIENT NAME:  Caitlin Crawford, Caitlin Crawford MR#:  [id number] DATE OF BIRTH:  1918-09-06  DATE OF ADMISSION:  02/10/2015  PRIMARY CARE PHYSICIAN:  Dr. Einar Crow with [facility].   CHIEF COMPLAINT: Syncope, confusion, and weakness.   HISTORY OF PRESENTING ILLNESS: A 81 year old Caucasian female patient with history of CKD stage III, hypertension, diabetes, dementia, and recently diagnosed atrial fibrillation and started on Eliquis, presents to the Emergency Room from a nursing home after she was noticed to have an episode of syncope.   The patient is unable to contribute to history at this point, but does follow commands. History has been obtained from old records, ER staff, and daughter at bedside.  Also her records from the nursing home have been reviewed.   The patient was recently discharged from the hospital on 02/07/2015 after being treated for pneumonia. According to the daughter she has had periods of confusion at the nursing home. She does talk at times, but most of the time she chooses not to speak. She seemed to be dizzy, unable to stand up at times, very weak to work with PT and OT. Today the patient was on a bedside commode and was being lifted up by PT when she was not responsive and had an episode of syncope for a few seconds after having a bowel movement, and then she woke up. Here in the Emergency Room the patient has been found to have melena, hemoglobin slowly trending down to 10 from high 11s.  Orthostatic vitals have not been done. She is in atrial fibrillation with hypernatremia, dehydration, elevated troponin, GI bleed, and is being admitted to the hospitalist service.   She continues to be on prednisone and azithromycin at the nursing home for her pneumonia and COPD exacerbation.   PAST MEDICAL HISTORY:  1. Diabetes mellitus.  2. Hypertension.  3. Dementia.  4. Right-sided facial palsy.  5. Atrial fibrillation on Eliquis.  6. CKD stage III.  7. Anemia of chronic disease.   PAST  SURGICAL HISTORY:  1. Right knee surgery.  2. Appendectomy.  3. Cholecystectomy.   ALLERGIES: MORPHINE WHICH CAUSES HALLUCINATIONS.   SOCIAL HISTORY: The patient smoked in the remote past when she was young. Does not drink any alcohol. No illicit drugs. Presently is at a nursing home. Used to ambulate with a walker on her own previously, but since recent discharge has been extremely weak. Also she was in assisted living, presently is at skilled nursing facility.   CODE STATUS: Full code.   FAMILY HISTORY: Unknown to patient.   REVIEW OF SYSTEMS: Unobtainable secondary to the patient's dementia.   HOME MEDICATIONS:  1. Metoprolol 25 mg oral 2 times a day.  2. Eliquis 2.5 mg 2 times a day.  3. Acetaminophen 650 mg every 6 hours as needed.  4. Amlodipine 5 mg daily.  5. Azithromycin 500 mg oral once a day until 02/13/2015.  6. Cardizem 240 mg oral once a day.  7. Docusate sodium 100 mg 2 times a day as needed.  8. Doxazosin 1 mg 2 tablets daily.  9. Ferrous sulfate 325 mg oral 3 times a day.  10. Fish oil 1000 mg oral once a day.  11. Lasix 20 mg daily.  12. Glimepiride 1 mg half a tablet daily.  13. Guaifenesin 100 mg 10 mL 3 times a day.  14. Ipratropium 2.5 mL inhaled every 6 hours.  15. Xopenex 3 mL inhaled every 6 hours as needed for wheezing.  16. Lisinopril 10 mg daily.  17. Lorazepam 0.5 mg oral every 4 hours as needed.  18. NovoLog sliding scale.  19. Nystatin 100,000 units 5 mL oral every 6 hours.  20. Potassium chloride 10 mEq daily.  21. Prednisone 10 mg once a day.  22. Promethazine 25 mg oral every 4 hours as needed for nausea and vomiting.  23. Promethazine 25 mg oral every 4 hours as needed.  24. Multivitamin tablet once daily.   PHYSICAL EXAMINATION:  VITAL SIGNS: Temperature 97.4, pulse of 99, blood pressure 136/70, saturating 94% on 3 liters oxygen.  GENERAL: Elderly, frail Caucasian female patient lying in bed, drowsy.  PSYCHIATRIC: Drowsy, nonverbal, but  follows commands.  HEENT: Atraumatic, normocephalic. Oral mucosa dry and pale. Has right-sided facial droop which is chronic.  NECK: Supple. No thyromegaly. No palpable lymph nodes. Trachea midline.  CARDIOVASCULAR: S1, S2, regular. No edema.  RESPIRATORY: Has bilateral rhonchi.  GASTROINTESTINAL: Soft abdomen. Tenderness in the left lower quadrant area. Bowel sounds normal. No rigidity or guarding.  GENITOURINARY: No CVA tenderness or bladder distention.  SKIN: Warm and dry. No petechiae or rash.  MUSCULOSKELETAL: No joint swelling, redness, effusion.  NEUROLOGICAL: Moves all 4 extremities. Motor strength 4/5 in upper and lower extremities, symmetrical.  LYMPHATIC: No cervical lymphadenopathy.   LABORATORY STUDIES:  1.  Glucose of 340, BUN 97, creatinine 2.19, with sodium 149, potassium 4.3, chloride 97, bicarbonate 41. AST, ALT mildly elevated at 108 and 48. Normal bilirubin.  2.  Troponin 0.11.  3.  WBC 17.2, hemoglobin 10.1, platelets of 140,000.  4.  Urinalysis shows rare bacteria, 0-5 WBC, with hyaline casts present.  5.  EKG shows atrial fibrillation, nothing acute.  6.  CT scan of the head without contrast shows right lateral cerebellar encephalomalacia along with previous right occipital craniotomy. No acute infarcts. She does have sinusitis with several air fluid levels suggesting sinusitis.  7.  CT scan of the abdomen and pelvis without contrast shows extensive colonic diverticulosis, no diverticulitis.  Resolving pneumonia on lower lobe opacities.  8.  Chest portable shows no active disease.   ASSESSMENT AND PLAN:  1.  Acute gastrointestinal bleed, could be upper or lower gastrointestinal bleed, but does seem to have some melena. CT scan shows extensive diverticulosis which is the possible cause at this point. She also has acute blood loss anemia. This is likely from the Eliquis that was started recently. After discussing with the daughter she has made the decision to stop the  Eliquis permanently due to her recent fall and also presently the GI bleed and also considering her advanced age. We will consult GI, put her on Protonix IV 40 b.i.d. along with serial hemoglobin levels with the first one in 6 hours. With her dehydration suspect her hemoglobin will fall with rehydration. Monitor closely, transfuse if less than 7. Consent obtained from daughter for blood transfusion. The patient might not be a candidate for an endoscopy considering her advanced age, recent pneumonia, but will defer to GI.  2.  Hypernatremia with dehydration. The patient has had decreased oral intake and continues to be on her Lasix 20 mg daily being the possible cause. At this point will be started on half-normal saline at 50. I have ordered for a liter, this needs to be renewed if the patient needs further fluids. Monitor for any fluid overload.  3.  Acute renal failure/chronic kidney disease stage III.  The patient will be on IV fluids. This is due to dehydration, and it should be monitored.  4.  Elevated troponin. Minimal at 0.11.  The patient does seem to have chronic elevation of troponin, reviewing her recent records levels were 0.11, 0.13, and 0.13. We will repeat to make sure this is stable and not trending up.  5.  Leukocytosis. The patient did have recent pneumonia, but this is likely from her steroid use. Needs to be monitored. No antibiotics being started, but we will continued the azithromycin which she was taking for her recent pneumonia.  6.  Diabetes mellitus. Put her on sliding scale insulin.  7.  Hypertension. Continue medications. We will hold lisinopril and Norvasc due to low normal pressure.  8.  Paroxysmal atrial fibrillation. Continue Cardizem and metoprolol, hold Eliquis.   9.  Deep vein thrombosis prophylaxis with sequential compression devices. No anticoagulation.   CODE STATUS: Full code.   I have had a lengthy discussion with the daughter regarding the patient's code status.  She mentions that she would like to review her signed documents from the past, also discuss with her siblings regarding her code status. She does understand her mother is getting worse and a few procedures that could be futile in her case at her advanced age. I have discussed the case with Dr. Harvie Junior, who will be seeing the patient.   TIME SPENT TODAY ON THIS CASE: 50 minutes.     ____________________________ Molinda Bailiff Sopheap Basic, MD srs:bu D: 02/10/2015 16:36:22 ET T: 02/10/2015 17:00:53 ET JOB#: [id number]  cc: Wardell Heath R. Hy Swiatek, MD, <Dictator> Marya Amsler. Dareen Piano, MD Orie Fisherman MD ELECTRONICALLY SIGNED 02/11/2015 10:10

## 2015-02-26 ENCOUNTER — Encounter: Payer: TRICARE For Life (TFL) | Admitting: Cardiology

## 2015-03-19 DEATH — deceased

## 2015-08-01 IMAGING — CT CT HEAD WITHOUT CONTRAST
2 series · 14 of 30 positions shown, 16 images · non-contrast
Comparison: CT head 01/31/2015

CLINICAL DATA: Fall.  Dementia.

EXAM:
CT HEAD WITHOUT CONTRAST
TECHNIQUE: Contiguous axial images were obtained from the base of the skull
through the vertex without intravenous contrast.

[Series 2: head bone · axial · 0.41mm/px · z∈[-129,-7]mm · 8 of 77 slices shown]
[im 8/77  bone]
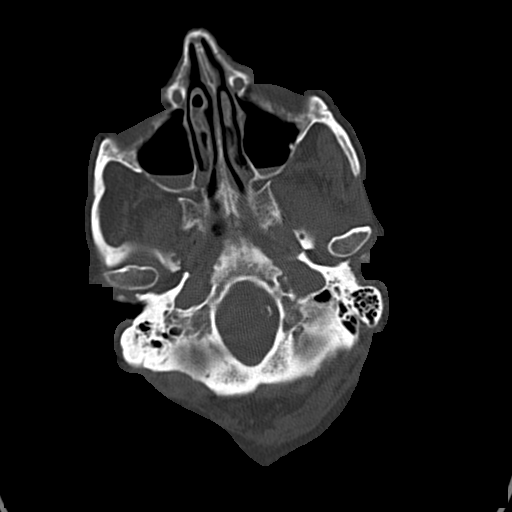
[im 15/77  bone]
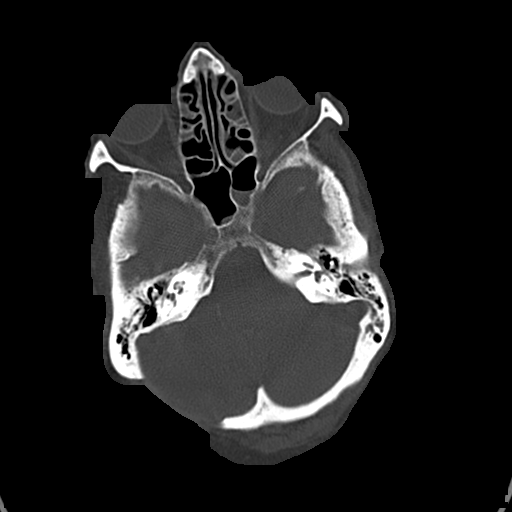
[im 26/77  bone]
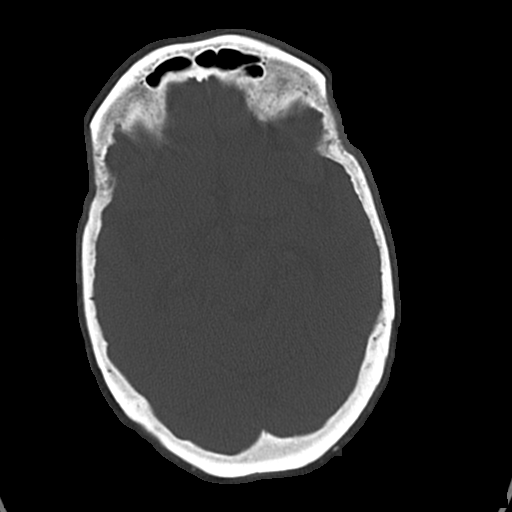
[im 33/77  bone]
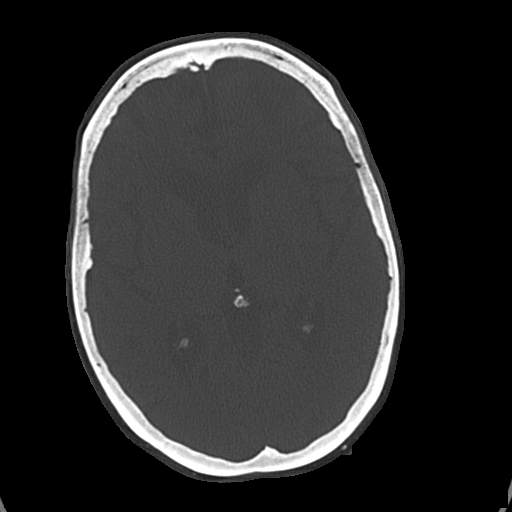
[im 44/77  bone]
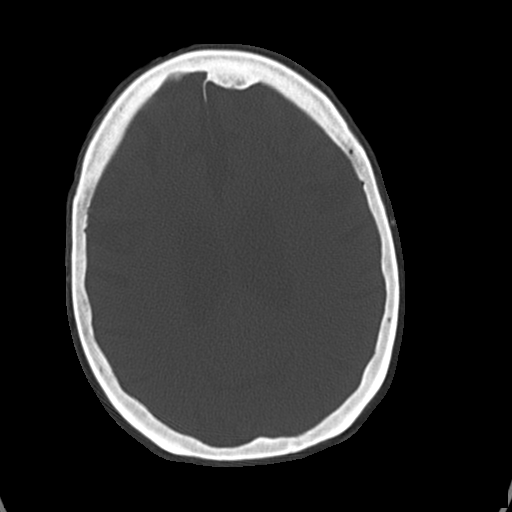
[im 51/77  bone]
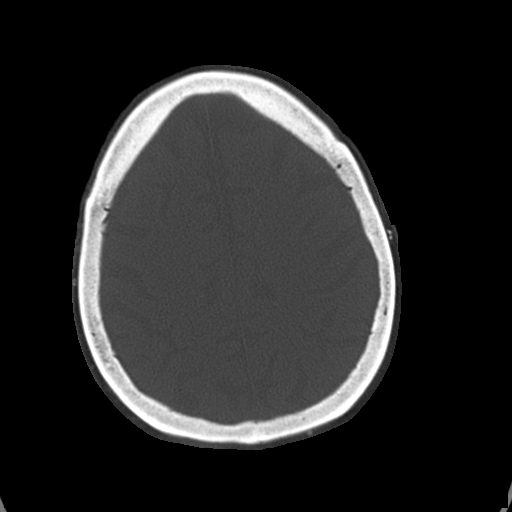
[im 62/77  bone]
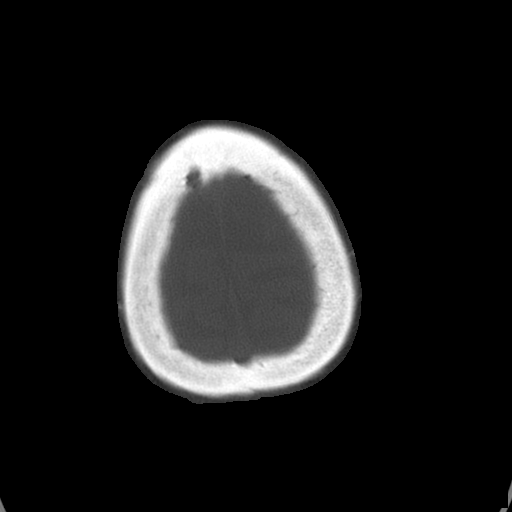
[im 69/77  bone]
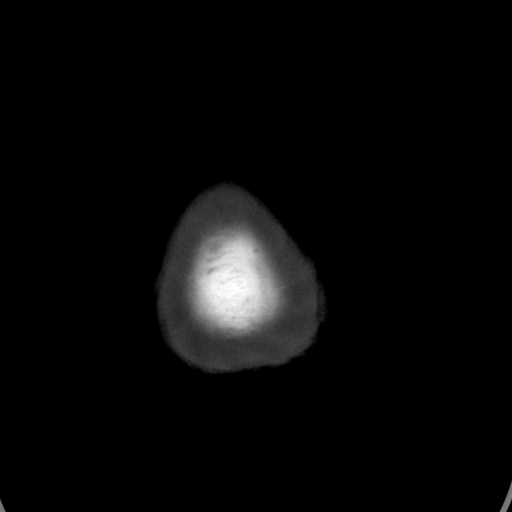

[Series 3: head wo · axial · 0.41mm/px · z∈[-119,-24]mm · 6 of 27 slices shown, 8 images]
[im 4/27  brain]
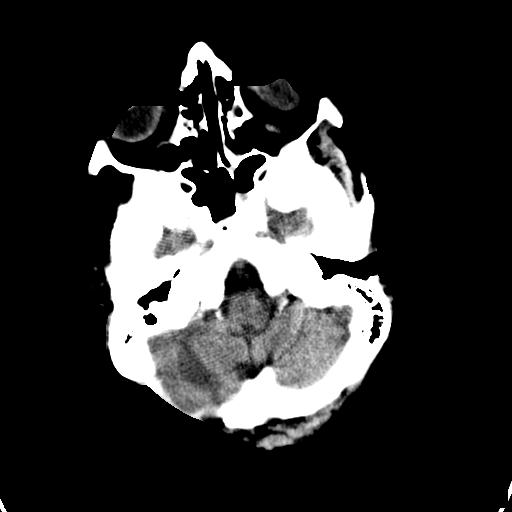
[im 4/27  bone]
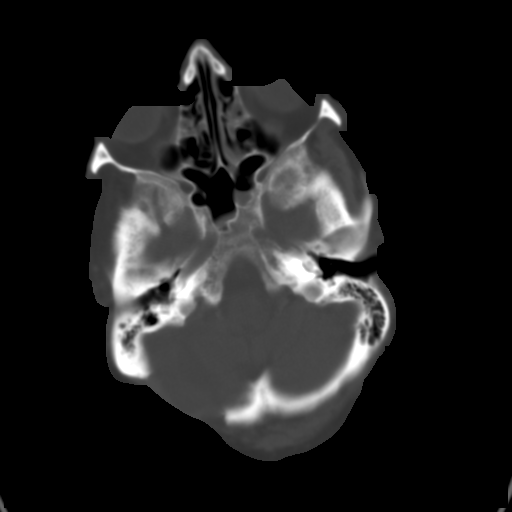
[im 8/27  brain]
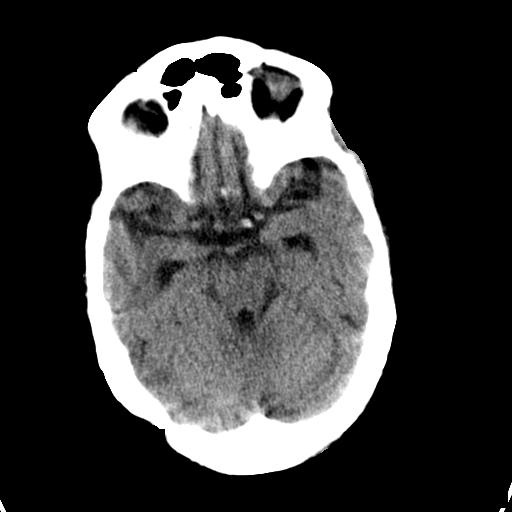
[im 12/27  brain]
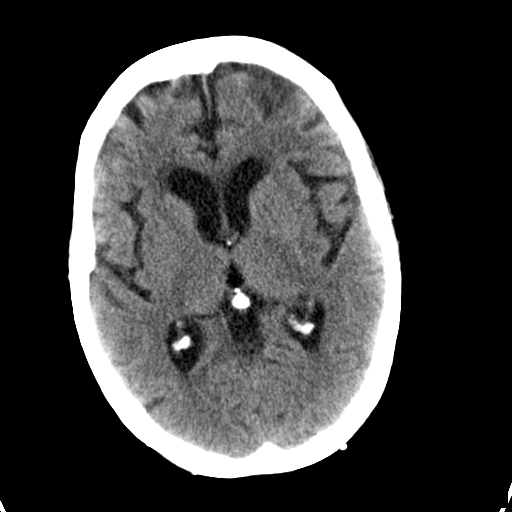
[im 15/27  brain]
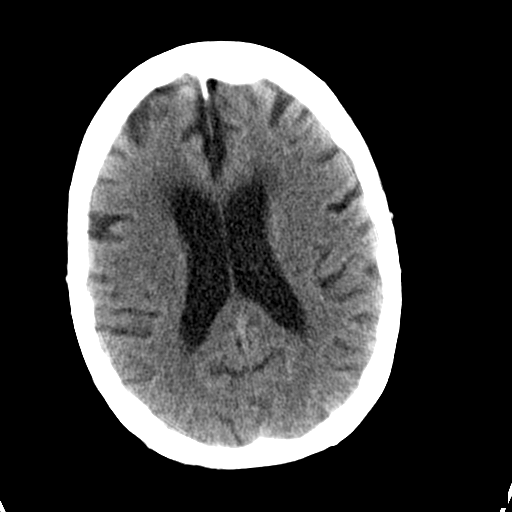
[im 19/27  brain]
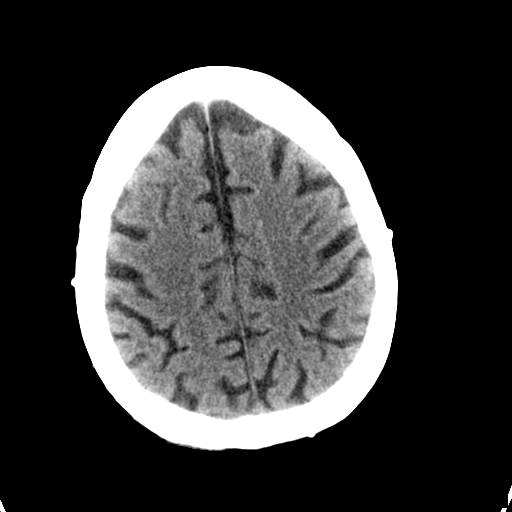
[im 19/27  bone]
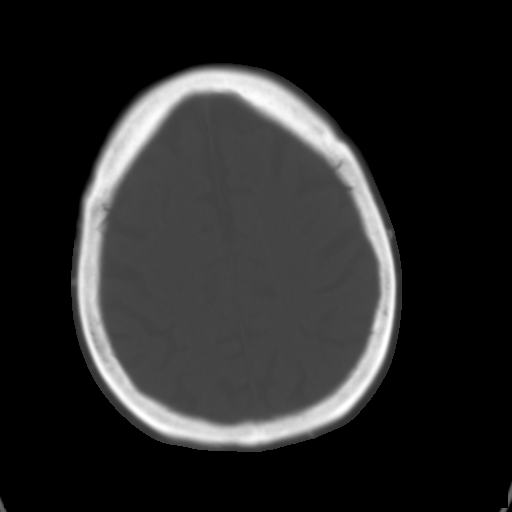
[im 23/27  brain]
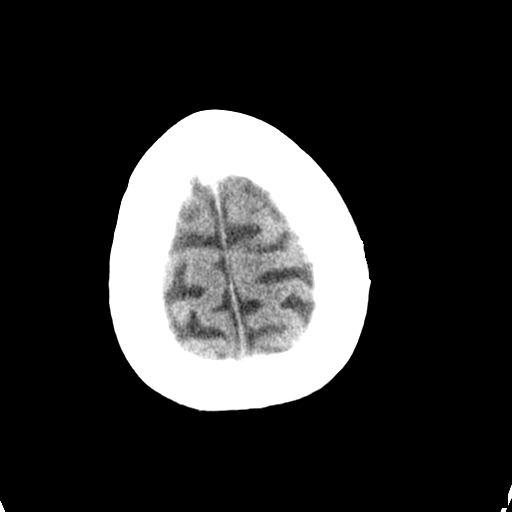

[14 of 30 positions shown; findings below may reference images not displayed]

FINDINGS: Generalized atrophy. Chronic microvascular ischemic change in the
white matter. No acute infarct or mass

Right occipital craniotomy. Encephalomalacia in the right cerebellum
is unchanged and presumably is related to prior surgery.

Sinusitis with mucosal edema and air-fluid levels in the sinuses.
IMPRESSION: Atrophy and chronic microvascular ischemia.  No acute abnormality

Sinusitis with multiple air-fluid levels.
